# Patient Record
Sex: Female | Born: 1985 | Race: White | Hispanic: No | Marital: Single | Smoking: Never smoker
Health system: Southern US, Community
[De-identification: ages and names within clinical notes are randomized; demographics above are authoritative.]

## PROBLEM LIST (undated history)

## (undated) DIAGNOSIS — K219 Gastro-esophageal reflux disease without esophagitis: Secondary | ICD-10-CM

## (undated) DIAGNOSIS — Z8781 Personal history of (healed) traumatic fracture: Secondary | ICD-10-CM

## (undated) DIAGNOSIS — F419 Anxiety disorder, unspecified: Secondary | ICD-10-CM

## (undated) DIAGNOSIS — B019 Varicella without complication: Secondary | ICD-10-CM

## (undated) DIAGNOSIS — F329 Major depressive disorder, single episode, unspecified: Secondary | ICD-10-CM

## (undated) DIAGNOSIS — A630 Anogenital (venereal) warts: Secondary | ICD-10-CM

## (undated) DIAGNOSIS — F988 Other specified behavioral and emotional disorders with onset usually occurring in childhood and adolescence: Secondary | ICD-10-CM

## (undated) DIAGNOSIS — J302 Other seasonal allergic rhinitis: Secondary | ICD-10-CM

## (undated) HISTORY — DX: Personal history of (healed) traumatic fracture: Z87.81

## (undated) HISTORY — DX: Major depressive disorder, single episode, unspecified: F32.9

## (undated) HISTORY — DX: Anxiety disorder, unspecified: F41.9

## (undated) HISTORY — DX: Other specified behavioral and emotional disorders with onset usually occurring in childhood and adolescence: F98.8

## (undated) HISTORY — DX: Gastro-esophageal reflux disease without esophagitis: K21.9

## (undated) HISTORY — DX: Varicella without complication: B01.9

## (undated) HISTORY — DX: Other seasonal allergic rhinitis: J30.2

## (undated) HISTORY — DX: Anogenital (venereal) warts: A63.0

---
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
## 2010-09-07 ENCOUNTER — Encounter: Payer: Self-pay | Admitting: Neurology

## 2011-10-24 ENCOUNTER — Ambulatory Visit: Payer: BC Managed Care – PPO

## 2013-11-22 ENCOUNTER — Encounter: Payer: Self-pay | Admitting: Internal Medicine

## 2013-11-22 ENCOUNTER — Ambulatory Visit (INDEPENDENT_AMBULATORY_CARE_PROVIDER_SITE_OTHER): Payer: 59 | Admitting: Internal Medicine

## 2013-11-22 VITALS — BP 122/60 | Temp 97.6°F | Ht 67.25 in | Wt 156.0 lb

## 2013-11-22 DIAGNOSIS — F419 Anxiety disorder, unspecified: Secondary | ICD-10-CM

## 2013-11-22 DIAGNOSIS — F411 Generalized anxiety disorder: Secondary | ICD-10-CM

## 2013-11-22 DIAGNOSIS — J029 Acute pharyngitis, unspecified: Secondary | ICD-10-CM

## 2013-11-22 DIAGNOSIS — F988 Other specified behavioral and emotional disorders with onset usually occurring in childhood and adolescence: Secondary | ICD-10-CM | POA: Insufficient documentation

## 2013-11-22 MED ORDER — LORAZEPAM 1 MG PO TABS: 0.5000 mg | ORAL_TABLET | Freq: Every evening | ORAL | Status: DC | PRN

## 2013-11-22 MED ORDER — SERTRALINE HCL 100 MG PO TABS: 100.0000 mg | ORAL_TABLET | Freq: Every day | ORAL | Status: DC

## 2013-11-22 NOTE — Patient Instructions (Signed)
Continue the sertraline as directed for now .  Advise lorazepam over alprazolam  For the reasons discussed  Rebound anxiety etc.  All are dependent producing  and do not  drink alcohol if taking these medication.  ROV  For med check and preventive visit .

## 2013-11-22 NOTE — Progress Notes (Signed)
Chief Complaint  Patient presents with  . Establish Care    Need to establish with a PCP.    HPI: Patient comes in as new patient visit . [facility]    [place] [place]se[place] here  bu far  She is on medication and wants to establish. Her pediatrician was Dr. Particia JasperKathleen Lucas.   Mom thinks she is depressed since younger  Age 28 got divorced  Had seen reactive    And add . Diagnosed fifth grade  Had se  Settled on sertraline .   Down to 100 mg  fo rnow 8-9 years.  No counselor now [place] [facility] [employer]  Tried counselor recently  didn't want to go further. Bf living together   Issues. Doesn't feel she is terribly depressed or super anxious  Anxiety comes at night   And ruminates at night  And takes small amount ofXanax for sleep.  Not that often    45 xanax given at the beginning of last year and still has many in the bottle. It is almost expired.  He states he been on a number of medicines and she was younger with side effects and is a bit anxious if she were to change it  Add in 5th grade   In academic performance   Esp math . stopped taking since working full time.  Drifting   off [employer]  Counselor .  Dr Ty HiltsKincaid.  concerta 27 extended release  . E learning medica specialist   at this time.  ROS:  GEN/ HEENT: No fever, significant weight changes sweats headaches vision problems hearing changes, as a minor sore throat for one day no fever CV/ PULM; No chest pain shortness of breath cough, syncope,edema  change in exercise tolerance. GI /GU: No adominal pain, vomiting, change in bowel habits. No blood in the stool. No significant GU symptoms. SKIN/HEME: ,no acute skin rashes suspicious lesions or bleeding. No lymphadenopathy, nodules, masses.  NEURO/ PSYCH:  No neurologic signs such as weakness numbness.. IMM/ Allergy: No unusual infections.  Allergy .   REST of 12 system review negative except as per HPI  Past Medical History  Diagnosis Date  . Chicken pox   .  GERD (gastroesophageal reflux disease)   . Seasonal allergies   . Genital warts     age 28 not sex transmitted some in gi tract or airway? vs  hsv?  . Anxiety and depression     realted to parent divorce   . ADD (attention deficit disorder)     dx 5th grade  revbecca kincaid; effect school perfomance math mostly on med when in school or studies   . Hx of fracture of wrist     soccer  . History of fracture of foot     Family History  Problem Relation Age of Onset  . Hypertension Father   . Depression Father   . ADD / ADHD Father   . Arthritis Maternal Aunt   . Arthritis Maternal Grandmother   . Arthritis Paternal Grandmother   . Breast cancer Paternal Grandmother   . Stroke Paternal Grandmother   . Alcohol abuse Paternal Grandfather   . Colon cancer Paternal Grandfather     History   Social History  . Marital Status: Single    Spouse Name: N/A    Number of Children: 0  . Years of Education: N/A   Social History Main Topics  . Smoking status: Never Smoker   .  Smokeless tobacco: None  . Alcohol Use: Yes     Comment: 3-4 times weekly will have 2 glasses of wine.  . Drug Use: No  . Sexual Activity: Yes    Partners: Male   Other Topics Concern  . None   Social History Narrative   8 hours of sleep per night   2 people living in the home bf   Has a dog in the home\   Neg tob soc etoh 4-5 pwe qwwk    caffeine am and tea in pm  Neg rd    Exercise more than 3 x per week.    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 etonogestrel-ethinyl estradiol (NUVARING) 0.12-0.015 MG/24HR vaginal ring Place 1 each vaginally every 28 (twenty-eight) days. Insert vaginally and leave in place for 3 consecutive weeks, then remove for 1 week.  . sertraline (ZOLOFT) 100 MG tablet Take 1 tablet (100 mg total) by mouth daily.  . [DISCONTINUED] ALPRAZolam  (XANAX) 0.5 MG tablet Take 1/2 to 1 tablet twice a day PRN  . [DISCONTINUED] sertraline (ZOLOFT) 100 MG tablet Take 100 mg by mouth daily.  Marland Kitchen LORazepam (ATIVAN) 1 MG tablet Take 0.5-1 tablets (0.5-1 mg total) by mouth at bedtime as needed for anxiety.  . methylphenidate (CONCERTA) 27 MG CR tablet Take 27 mg by mouth every morning.    EXAM:  BP 122/60  Temp(Src) 97.6 F (36.4 C) (Oral)  Ht 5' 7.25" (1.708 m)  Wt 156 lb (70.761 kg)  BMI 24.26 kg/m2  LMP 11/10/2013  Body mass index is 24.26 kg/(m^2). Physical Exam: Vital signs reviewed NWG:NFAO is a well-developed well-nourished alert cooperative  female who appears her stated age in no acute distress.  Som excess motor movement  HEENT: normocephalic atraumatic , Eyes: PERRL EOM's full, conjunctiva clear, Nares: paten,t no deformity discharge or tenderness., Ears: no deformity EAC's clear TMs with normal landmarks. Mouth: clear OP, no lesions, edema normal erythema.  Moist mucous membranes. Dentition in adequate repair. NECK: supple without masses, thyromegaly or bruits. CHEST/PULM:  Clear to auscultation and percussion breath sounds equal no wheeze , rales or rhonchi.CV: PMI is nondisplaced, S1 S2 no gallops, murmurs, rubs. Peripheral pulses are full without delay.No JVD .  ABDOMEN: Bowel sounds normal nontender  No guard or rebound, no hepato splenomegal no CVA tenderness.  Extremtities:  No clubbing cyanosis or edema, no acute joint swelling or redness no focal atrophy NEURO:  Oriented x3, cranial nerves 3-12 appear to be intact, no obvious focal weakness,gait within normal limits no tremors  SKIN: No acute rashes normal turgor, color, no bruising or petechiae. PSYCH: Oriented, good eye contact, no obvious depression anxiety, cognition and judgment appear normal. LN: no cervical adenopathy     ASSESSMENT AND PLAN:  Discussed the following assessment and plan:  ADD (attention deficit disorder) - dx 5th grade bypsych on meds fopr  school concerta 27 not needed recenetly   Anxiety - effecting sleep initiation at times  prev pcp gave xanax .5 hs prn still has left over 45 from a year ago cont zoloft at 100 mg   Sore throat - 1 day  poss viral expectant managment Patient apparently rarely uses the benzos for night but discussed warnings and risk of this medication of rebound and dependency. Will switch to lorazepam although she has anxiety about changing  to medicine she doesn't know. She hasn't used very much and shows a bottle given to her over a year ago but still has many tablets remaining. She's currently not taking her Concerta but has an old bottle. If need be we can prescribe this medication discussed contract and tox screens etc. I think she has done well on her sertraline at this time would not change at continue 100 mg followup in about 6 months for preventive visit and med check. -Patient advised to return or notify health care team  if symptoms worsen ,persist or new concerns arise.  Patient Instructions  Continue the sertraline as directed for now .  Advise lorazepam over alprazolam  For the reasons discussed  Rebound anxiety etc.  All are dependent producing  and do not  drink alcohol if taking these medication.  ROV  For med check and preventive visit .         Neta Mends. Skilar Marcou M.D.  Pre visit review using our clinic review tool, if applicable. No additional management support is needed unless otherwise documented below in the visit note.

## 2013-12-26 ENCOUNTER — Telehealth: Payer: Self-pay | Admitting: Internal Medicine

## 2013-12-26 NOTE — Telephone Encounter (Signed)
Patient Information:  Caller Name: Hale BogusMara  Phone: [phone]  Patient: Patty Branch, Patty Branch  Gender: Female  DOB: 1986/07/02  Age: [age] Years  PCP: Berniece AndreasPanosh, Wanda (Family Practice)  Pregnant: No  Office Follow Up:  Does the office need to follow up with this patient?: No  Instructions For The Office: N/A   Symptoms  Reason For Call & Symptoms: Woke up for past 3 mornings with Hives all over except not on her face. Rash goes away by the end of the day. She woke up itching last night and took cool shower and switched beds and still woke up with rash. She has had occasional cough and stuffy runny nose for past 3 weeks. She switched from Zyrtec to Claritin one week ago and has been taking Claritin daily- last dose was at 0400. Hives starting to fade in some areas now 10:30. Advised OV for Allergy Sx not helped with antihistamine (cough, runny nose) and she refused. She is going to try Benedryl 25 mgs PO every 6 hours tonight and will call back if not helping with sx.  Reviewed Health History In EMR: Yes  Reviewed Medications In EMR: Yes  Reviewed Allergies In EMR: Yes  Reviewed Surgeries / Procedures: Yes  Date of Onset of Symptoms: 12/23/2013  Treatments Tried: Claritin, Hydrocortisone Cream  Treatments Tried Worked: No OB / GYN:  LMP: 12/05/2013  Guideline(s) Used:  Rash or Redness - Widespread  Hives  Hay Fever - Nasal Allergies  Disposition Per Guideline:   See Today or Tomorrow in Office  Reason For Disposition Reached:   Nasal discharge present > 10 days  Advice Given:  Food Related Hives:  Foods can cause transient hives, especially around the mouth.  Some are mild food allergies; others can occur in anyone (e.g., with strawberries).  Hives from foods usually disappear within 6 hours.  Antihistamine  One or two dosages of an antihistamine will accelerate the clearing of this type of hives.  Benadryl (diphenhydramine) is an antihistamine. The adult dose is 25-50 mg. If the hives  are still present after 6 hours, repeat the Benadryl.  Prevention  : In the future, avoid any food you think caused the hives.  Prevention  : In the future, avoid any food you think caused the hives.  Call Back If:  Severe hives or severe itching persist more than 24 hours despite taking an antihistamine (e.g., Benadryl)  You become worse.  Widespread Hives:  Remove allergens. For widespread hives be certain to take a bath or shower, if triggered by pollens or animal contact. Change clothes.  Take a cool bath for 10 minutes to relieve itching. Rub very itchy areas with an ice cube for 10 minutes.  Hives normally come and go for 3 or 4 days, then disappear.  Antihistamine  Take an antihistamine like loratadine (e.g., OTC Claritin, Alavert) for widespread hives that itch. The adult dosage of loratadine is 10 mg by mouth once each day. Continue the antihistamine until the hives have been gone for 24 hours.  Loratidine is a newer (second-generation) antihistamine and it causes less sedation than diphenhydramine (Benadryl) or chlorpheniramine (Chlor-Trimeton).  CAUTION: This type of medication may cause sleepiness. Do not drink alcohol, drive, or operate dangerous machinery while taking antihistamines. Do not take these medications if you have prostate problems.  Contagiousness:  Hives are not contagious. You can return to work or school if the hives do not interfere with normal activities.  Prevention:  If you identify a substance  that causes hives, try to avoid that substance in the future.  Call Back If:  Severe itching lasts longer than 24 hours while taking an antihistamine  Hives persist longer than 1 week  You become worse.  Wash off Pollen Daily:  Remove pollen from the body with hair washing and a shower, especially before bedtime.  Avoiding Pollen:  Stay indoors on windy days  Keep windows closed in home, at least in bedroom; use air conditioner  Use a high efficiency house air  filter (HEPA or electrostatic)  Keep windows closed in car, turn AC on recirculate  Avoid playing with outdoor dog  For a Stuffy Nose - Use Nasal Washes:  Introduction: Saline (salt water) nasal irrigation (nasal washes) is an effective and simple home remedy for treating stuffy nose and sinus congestion. The nose can be irrigated by pouring, spraying, or squirting salt water into the nose and then letting it run back out.  How it Helps: The salt water rinses out excess mucus, washes out any irritants (dust, allergens) that might be present, and moistens the nasal cavity.  Antihistamine Medications for Hay Fever:  Antihistamines help reduce sneezing, itching, and runny nose.  You may need to take antihistamines continuously during pollen season (Reason: continuously is the key to control).  Call Back If:  Symptoms are not controlled in 2 days with continuous antihistamines  You become worse  Patient Refused Recommendation:  Patient Will Make Own Appointment  [facility] and will call back if hives persist.

## 2013-12-26 NOTE — Telephone Encounter (Signed)
Noted  

## 2014-03-29 ENCOUNTER — Telehealth: Payer: Self-pay | Admitting: Internal Medicine

## 2014-03-29 NOTE — Telephone Encounter (Signed)
Pt is needing new rx sertraline (ZOLOFT) 100 MG tablet, send to [facility].

## 2014-03-29 NOTE — Telephone Encounter (Signed)
Called and spoke to Korearista at the pharmacy.  Pt picked this prescription up on 03/27/14 and still has refills on file.  Request denied at this time.

## 2014-04-12 ENCOUNTER — Ambulatory Visit (INDEPENDENT_AMBULATORY_CARE_PROVIDER_SITE_OTHER): Payer: 59 | Admitting: Physician Assistant

## 2014-04-12 ENCOUNTER — Encounter: Payer: Self-pay | Admitting: Physician Assistant

## 2014-04-12 VITALS — BP 120/80 | HR 73 | Temp 98.2°F | Resp 18 | Wt 158.1 lb

## 2014-04-12 DIAGNOSIS — J0100 Acute maxillary sinusitis, unspecified: Secondary | ICD-10-CM

## 2014-04-12 MED ORDER — DOXYCYCLINE HYCLATE 100 MG PO TABS: 100.0000 mg | ORAL_TABLET | Freq: Two times a day (BID) | ORAL | Status: DC

## 2014-04-12 MED ORDER — HYDROCODONE-HOMATROPINE 5-1.5 MG/5ML PO SYRP: 5.0000 mL | ORAL_SOLUTION | Freq: Three times a day (TID) | ORAL | Status: DC | PRN

## 2014-04-12 NOTE — Progress Notes (Signed)
Pre visit review using our clinic review tool, if applicable. No additional management support is needed unless otherwise documented below in the visit note. 

## 2014-04-12 NOTE — Patient Instructions (Addendum)
Doxycycline twice daily for 10 days for sinus infection. Make sure to take with a full meal to prevent nausea. Reduce sun exposure while taking as it can increase sensitivity to sun burn, and make sure to wear barrier protection in addition to taking your birth control pills as this medication is contraindicated during pregnancy.  Hycodan every 8 hours as needed for cough. Do not drive while taking this medication as it will inhibit your ability to operate a motor vehicle.  Plain Over the Counter Mucinex (NOT Mucinex D) for thick secretions  Force NON dairy fluids, drinking plenty of water is best.    Over the Counter Flonase OR Nasacort AQ 1 spray in each nostril twice a day as needed. Use the "crossover" technique into opposite nostril spraying toward opposite ear @ 45 degree angle, not straight up into nostril.   Plain Over the Counter Allegra (NOT D )  160 daily , OR Loratidine 10 mg , OR Zyrtec 10 mg @ bedtime  as needed for itchy eyes & sneezing.  Saline Irrigation and Saline Sprays can also help reduce symptoms.  If emergency symptoms discussed during visit developed, seek medical attention immediately.  Followup as needed, or for worsening or persistent symptoms despite treatment.    Sinusitis Sinusitis is redness, soreness, and puffiness (inflammation) of the air pockets in the bones of your face (sinuses). The redness, soreness, and puffiness can cause air and mucus to get trapped in your sinuses. This can allow germs to grow and cause an infection.  HOME CARE   Drink enough fluids to keep your pee (urine) clear or pale yellow.  Use a humidifier in your home.  Run a hot shower to create steam in the bathroom. Sit in the bathroom with the door closed. Breathe in the steam 3-4 times a day.  Put a warm, moist washcloth on your face 3-4 times a day, or as told by your doctor.  Use salt water sprays (saline sprays) to wet the thick fluid in your nose. This can help the sinuses  drain.  Only take medicine as told by your doctor. GET HELP RIGHT AWAY IF:   Your pain gets worse.  You have very bad headaches.  You are sick to your stomach (nauseous).  You throw up (vomit).  You are very sleepy (drowsy) all the time.  Your face is puffy (swollen).  Your vision changes.  You have a stiff neck.  You have trouble breathing. MAKE SURE YOU:   Understand these instructions.  Will watch your condition.  Will get help right away if you are not doing well or get worse. Document Released: 01/21/2008 Document Revised: 04/28/2012 Document Reviewed: 03/09/2012 [facility] Patient Information 2015 [place], [place]. This information is not intended to replace advice given to you by your health care provider. Make sure you discuss any questions you have with your health care provider.

## 2014-04-12 NOTE — Progress Notes (Signed)
Subjective:    Patient ID: Patty Branch, female    DOB: August 03, 1986, 28 y.o.   MRN: [id number]  Cough This is a new problem. The current episode started 1 to 4 weeks ago (10 days). The problem has been gradually worsening. The problem occurs hourly (few times per hour). The cough is productive of sputum. Associated symptoms include chest pain (central chest, sternal area, very superficial. Hurts worse with coughing spells, no radiation), nasal congestion, postnasal drip, rhinorrhea and shortness of breath (unsure if this is related, or if it is just anxiety over work (history of anxiety treate with zoloft)). Pertinent negatives include no chills, ear congestion, ear pain, fever, headaches, heartburn, hemoptysis, myalgias, rash, sore throat, sweats, weight loss or wheezing. Nothing aggravates the symptoms. Treatments tried: ibuprofen for chest pain. The treatment provided moderate (advil helps the pain) relief. Her past medical history is significant for asthma (exercise induced asthma, no symptoms of this for years) and environmental allergies (takes claritin for this.). There is no history of COPD.      Review of Systems  Constitutional: Negative for fever, chills and weight loss.  HENT: Positive for congestion, postnasal drip, rhinorrhea and sinus pressure (mild). Negative for ear discharge, ear pain and sore throat.   Respiratory: Positive for cough and shortness of breath (unsure if this is related, or if it is just anxiety over work (history of anxiety treate with zoloft)). Negative for hemoptysis and wheezing.   Cardiovascular: Positive for chest pain (central chest, sternal area, very superficial. Hurts worse with coughing spells, no radiation).  Gastrointestinal: Negative for heartburn, nausea, vomiting and diarrhea.  Musculoskeletal: Negative for myalgias.  Skin: Negative for rash.  Allergic/Immunologic: Positive for environmental allergies (takes claritin for this.).  Neurological:  Negative for syncope and headaches.  All other systems reviewed and are negative.    Past Medical History  Diagnosis Date  . Chicken pox   . GERD (gastroesophageal reflux disease)   . Seasonal allergies   . Genital warts     age 21 not sex transmitted some in gi tract or airway? vs  hsv?  . Anxiety and depression     realted to parent divorce   . ADD (attention deficit disorder)     dx 5th grade  revbecca kincaid; effect school perfomance math mostly on med when in school or studies   . Hx of fracture of wrist     soccer  . History of fracture of foot     History   Social History  . Marital Status: Single    Spouse Name: N/A    Number of Children: 0  . Years of Education: N/A   Occupational History  . Not on file.   Social History Main Topics  . Smoking status: Never Smoker   . Smokeless tobacco: Not on file  . Alcohol Use: Yes     Comment: 3-4 times weekly will have 2 glasses of wine.  . Drug Use: No  . Sexual Activity: Yes    Partners: Male   Other Topics Concern  . Not on file   Social History Narrative   8 hours of sleep per night   2 people living in the home bf   Has a dog in the home\   Neg tob soc etoh 4-5 pwe qwwk    caffeine am and tea in pm  Neg rd    Exercise more than 3 x per week.    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 Tonsillectomy and adenoidectomy  1993    Family History  Problem Relation Age of Onset  . Hypertension Father   . Depression Father   . ADD / ADHD Father   . Arthritis Maternal Aunt   . Arthritis Maternal Grandmother   . Arthritis Paternal Grandmother   . Breast cancer Paternal Grandmother   . Stroke Paternal Grandmother   . Alcohol abuse Paternal Grandfather   . Colon cancer Paternal Grandfather     Allergies  Allergen Reactions  . Amoxicillin     Unsure of reaction.  Found when she was  an infant  . Augmentin [Amoxicillin-Pot Clavulanate]     Unsure of reaction.  Found when she was an infant  . Ceclor [Cefaclor]     Unsure of reaction.  Found when she was an infant  . Duricef [Cefadroxil]     Unsure of reaction.  Found when she was an infant.  . Lorabid [Loracarbef]     Unsure of reaction.  Found when she was an infant    Current Outpatient Prescriptions on File Prior to Visit  Medication Sig Dispense Refill  . etonogestrel-ethinyl estradiol (NUVARING) 0.12-0.015 MG/24HR vaginal ring Place 1 each vaginally every 28 (twenty-eight) days. Insert vaginally and leave in place for 3 consecutive weeks, then remove for 1 week.      . methylphenidate (CONCERTA) 27 MG CR tablet Take 27 mg by mouth every morning.      . sertraline (ZOLOFT) 100 MG tablet Take 1 tablet (100 mg total) by mouth daily.  90 tablet  2  . LORazepam (ATIVAN) 1 MG tablet Take 0.5-1 tablets (0.5-1 mg total) by mouth at bedtime as needed for anxiety.  30 tablet  0   No current facility-administered medications on file prior to visit.    EXAM: BP 120/80  Pulse 73  Temp(Src) 98.2 F (36.8 C) (Oral)  Resp 18  Wt 158 lb 1.6 oz (71.714 kg)  SpO2 99%  LMP 03/29/2014     Objective:   Physical Exam  Nursing note and vitals reviewed. Constitutional: She is oriented to person, place, and time. She appears well-developed and well-nourished. No distress.  HENT:  Head: Normocephalic and atraumatic.  Right Ear: External ear normal.  Left Ear: External ear normal.  Nose: Nose normal.  Mouth/Throat: No oropharyngeal exudate.  Oropharynx is slightly erythematous, no exudate. Bilateral TMs normal. Bilateral frontal sinuses non-TTP. Right maxillary sinus non-TTP. Left maxillary sinus TTP.  Eyes: Conjunctivae and EOM are normal. Pupils are equal, round, and reactive to light.  Neck: Normal range of motion. Neck supple.  Cardiovascular: Normal rate, regular rhythm and intact distal pulses.   Pulmonary/Chest:  Effort normal and breath sounds normal. No stridor. No respiratory distress. She has no wheezes. She has no rales. She exhibits no tenderness.  Lymphadenopathy:    She has no cervical adenopathy.  Neurological: She is alert and oriented to person, place, and time.  Skin: Skin is warm and dry. She is not diaphoretic.  Psychiatric: She has a normal mood and affect. Her behavior is normal. Judgment and thought content normal.     No results found for this basename: WBC, HGB, HCT, PLT, GLUCOSE, CHOL, TRIG, HDL, LDLDIRECT, LDLCALC, ALT, AST, NA, K, CL, CREATININE, BUN, CO2, TSH, PSA, INR, GLUF, HGBA1C, MICROALBUR        Assessment & Plan:  Hale Bogus  was seen today for cough.  Diagnoses and associated orders for this visit:  Acute maxillary sinusitis, recurrence not specified Comments: focal ttp, 10 days of symptoms, will treat with Doxycycline and hycodan for cough. add otc mucinex, nasal steroid, antihistamine, rest, push fluids. - HYDROcodone-homatropine (HYCODAN) 5-1.5 MG/5ML syrup; Take 5 mLs by mouth every 8 (eight) hours as needed for cough. - doxycycline (VIBRA-TABS) 100 MG tablet; Take 1 tablet (100 mg total) by mouth 2 (two) times daily.    Patient was advised against driving while taking Hycodan as it can inhibit her ability to operate a motor vehicle.  Patient also advised to limit sun exposure as doxycycline can increase her sensitivity to sun.   Return precautions provided, and patient handout on sinusitis.  Plan to follow up as needed, or for worsening or persistent symptoms despite treatment.  Patient Instructions  Doxycycline twice daily for 10 days for sinus infection. Make sure to take with a full meal to prevent nausea. Reduce sun exposure while taking as it can increase sensitivity to sun burn, and make sure to wear barrier protection in addition to taking your birth control pills as this medication is contraindicated during pregnancy.  Hycodan every 8 hours as needed  for cough. Do not drive while taking this medication as it will inhibit your ability to operate a motor vehicle.  Plain Over the Counter Mucinex (NOT Mucinex D) for thick secretions  Force NON dairy fluids, drinking plenty of water is best.    Over the Counter Flonase OR Nasacort AQ 1 spray in each nostril twice a day as needed. Use the "crossover" technique into opposite nostril spraying toward opposite ear @ 45 degree angle, not straight up into nostril.   Plain Over the Counter Allegra (NOT D )  160 daily , OR Loratidine 10 mg , OR Zyrtec 10 mg @ bedtime  as needed for itchy eyes & sneezing.  Saline Irrigation and Saline Sprays can also help reduce symptoms.  If emergency symptoms discussed during visit developed, seek medical attention immediately.  Followup as needed, or for worsening or persistent symptoms despite treatment.

## 2014-08-15 ENCOUNTER — Other Ambulatory Visit: Payer: Self-pay | Admitting: Internal Medicine

## 2014-08-17 ENCOUNTER — Telehealth: Payer: Self-pay | Admitting: Family Medicine

## 2014-08-17 ENCOUNTER — Other Ambulatory Visit: Payer: Self-pay | Admitting: Family Medicine

## 2014-08-17 DIAGNOSIS — Z0000 Encounter for general adult medical examination without abnormal findings: Secondary | ICD-10-CM

## 2014-08-17 NOTE — Telephone Encounter (Signed)
Pt needs yearly wellness visit in 90 days.  Please help the pt to get on the schedule.  Then send message back and will place lab orders.  Thanks!

## 2014-08-17 NOTE — Telephone Encounter (Signed)
Ok to refill x 90 days  Have her get preventive visit yearly check before runs out.

## 2014-08-17 NOTE — Telephone Encounter (Signed)
Sent to the pharmacy by e-scribe for 90 days.  Will send a message to scheduling to help her get on the schedule.

## 2014-08-17 NOTE — Telephone Encounter (Signed)
Labs should be done one week prior to CPX.  Please schedule unless pt is unable.  Thanks!

## 2014-08-17 NOTE — Telephone Encounter (Signed)
Pt called to fu on rx zoloft.  I saw your note and scheduled pt cpe in feb.

## 2014-08-17 NOTE — Telephone Encounter (Signed)
Pt request same day due to schedule.

## 2014-10-11 ENCOUNTER — Ambulatory Visit (INDEPENDENT_AMBULATORY_CARE_PROVIDER_SITE_OTHER): Payer: 59 | Admitting: Internal Medicine

## 2014-10-11 ENCOUNTER — Encounter: Payer: Self-pay | Admitting: Internal Medicine

## 2014-10-11 VITALS — BP 104/70 | Temp 97.9°F | Ht 66.5 in | Wt 153.7 lb

## 2014-10-11 DIAGNOSIS — Z79899 Other long term (current) drug therapy: Secondary | ICD-10-CM

## 2014-10-11 DIAGNOSIS — Z0000 Encounter for general adult medical examination without abnormal findings: Secondary | ICD-10-CM

## 2014-10-11 DIAGNOSIS — F909 Attention-deficit hyperactivity disorder, unspecified type: Secondary | ICD-10-CM

## 2014-10-11 DIAGNOSIS — F4323 Adjustment disorder with mixed anxiety and depressed mood: Secondary | ICD-10-CM

## 2014-10-11 DIAGNOSIS — F988 Other specified behavioral and emotional disorders with onset usually occurring in childhood and adolescence: Secondary | ICD-10-CM

## 2014-10-11 MED ORDER — ALPRAZOLAM 0.5 MG PO TABS: ORAL_TABLET | ORAL | Status: DC

## 2014-10-11 MED ORDER — METHYLPHENIDATE HCL ER (OSM) 18 MG PO TBCR: 18.0000 mg | EXTENDED_RELEASE_TABLET | Freq: Every day | ORAL | Status: DC

## 2014-10-11 NOTE — Progress Notes (Signed)
Pre visit review using our clinic review tool, if applicable. No additional management support is needed unless otherwise documented below in the visit note.  Chief Complaint  Patient presents with  . Annual Exam  . ADHD  . Medication Refill    HPI: Patient  Patty Branch Donate  29 y.o. comes in today for Preventive Health Care visit  And medication management   adhd just started taking concerta   To do gre  27 mg  makin her feel strange pounding hart     18 is better .   Sat   gre   gre test.  to  international  and communication.   Would like to  Apply  For college in  [place] .  Last pap beth lane .  This past year.  Never took the ativan  Would prefer the alprazol 0.5 1/2 to 1  Had some left over   over if needed in night for anxiety . Asks about how  to wean zoloft   On med mostly for depression since age 89  Missed a few doses unintentionally and felt bad. Hx of counselors in past never cliqued or helpd that much  .  Currently not depressed  Health Maintenance  Topic Date Due  . HIV Screening  05/06/2001  . INFLUENZA VACCINE  11/16/2014 (Originally 03/18/2014)  . PAP SMEAR  04/18/2016  . TETANUS/TDAP  08/18/2021   Health Maintenance Review LIFESTYLE:  Exercise:  Teach zumba class  And gym Tobacco/ETS: no Alcohol:  8 per week .  Sugar beverages:  coffe honey  Sleep:  About 8 hours  Drug use: no PAP: utd  Periods  Regular on ring   ROS:  GEN/ HEENT: No fever, significant weight changes sweats headaches vision problems hearing changes, CV/ PULM; No chest pain shortness of breath cough, syncope,edema  change in exercise tolerance. GI /GU: No adominal pain, vomiting, change in bowel habits. No blood in the stool. No significant GU symptoms. SKIN/HEME: ,no acute skin rashes suspicious lesions or bleeding. No lymphadenopathy, nodules, masses.  NEURO/ PSYCH:  No neurologic signs such as weakness numbness. No depression anxiety. IMM/ Allergy: No unusual infections.  Allergy .     REST of 12 system review negative except as per HPI   Past Medical History  Diagnosis Date  . Chicken pox   . GERD (gastroesophageal reflux disease)   . Seasonal allergies   . Genital warts     age 29 not sex transmitted some in gi tract or airway? vs  hsv?  . Anxiety and depression     realted to parent divorce   . ADD (attention deficit disorder)     dx 5th grade  rev[employer]; effect school perfomance math mostly on med when in school or studies   . Hx of fracture of wrist     soccer  . History of fracture of foot     Past Surgical History  Procedure Laterality Date  . Tonsillectomy and adenoidectomy  1993    Family History  Problem Relation Age of Onset  . Hypertension Father   . Depression Father   . ADD / ADHD Father   . Arthritis Maternal Aunt   . Arthritis Maternal Grandmother   . Arthritis Paternal Grandmother   . Breast cancer Paternal Grandmother   . Stroke Paternal Grandmother   . Alcohol abuse Paternal Grandfather   . Colon cancer Paternal Grandfather     History   Social History  . Marital Status:  Single    Spouse Name: N/A  . Number of Children: 0  . Years of Education: N/A   Social History Main Topics  . Smoking status: Never Smoker   . Smokeless tobacco: Not on file  . Alcohol Use: Yes     Comment: 3-4 times weekly will have 2 glasses of wine.  . Drug Use: No  . Sexual Activity:    Partners: Male   Other Topics Concern  . None   Social History Narrative   8 hours of sleep per night   2 people living in the home bf   Has a dog in the home\   Neg tob soc etoh 4-5 pwe qwwk    caffeine am and tea in pm  Neg rd    Exercise more than 3 x per week.    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 etonogestrel-ethinyl estradiol (NUVARING) 0.12-0.015 MG/24HR vaginal ring Place 1 each vaginally every  28 (twenty-eight) days. Insert vaginally and leave in place for 3 consecutive weeks, then remove for 1 week.  . sertraline (ZOLOFT) 100 MG tablet take 1 tablet by mouth once daily  . [DISCONTINUED] methylphenidate (CONCERTA) 27 MG CR tablet Take 27 mg by mouth every morning.  Marland Kitchen ALPRAZolam (XANAX) 0.5 MG tablet 1/2- 1 po hs as needed for anxiety  Avoid regular use.  Do not take with alcohol.  . methylphenidate (CONCERTA) 18 MG PO CR tablet Take 1 tablet (18 mg total) by mouth daily.  . [DISCONTINUED] doxycycline (VIBRA-TABS) 100 MG tablet Take 1 tablet (100 mg total) by mouth 2 (two) times daily.  . [DISCONTINUED] HYDROcodone-homatropine (HYCODAN) 5-1.5 MG/5ML syrup Take 5 mLs by mouth every 8 (eight) hours as needed for cough.  . [DISCONTINUED] LORazepam (ATIVAN) 1 MG tablet Take 0.5-1 tablets (0.5-1 mg total) by mouth at bedtime as needed for anxiety. (Patient not taking: Reported on 10/11/2014)    EXAM:  BP 104/70 mmHg  Temp(Src) 97.9 F (36.6 C) (Oral)  Ht 5' 6.5" (1.689 m)  Wt 153 lb 11.2 oz (69.718 kg)  BMI 24.44 kg/m2  Body mass index is 24.44 kg/(m^2).  Physical Exam: Vital signs reviewed YQM:VHQI is a well-developed well-nourished alert cooperative    who appearsr stated age in no acute distress.  HEENT: normocephalic atraumatic , Eyes: PERRL EOM's full, conjunctiva clear, Nares: paten,t no deformity discharge or tenderness., Ears: no deformity EAC's clear TMs with normal landmarks. Mouth: clear OP, no lesions, edema.  Moist mucous membranes. Dentition in adequate repair. NECK: supple without masses, thyromegaly or bruits. CHEST/PULM:  Clear to auscultation and percussion breath sounds equal no wheeze , rales or rhonchi. No chest wall deformities or tenderness. Breast: normal by inspection . No dimpling, discharge, masses, tenderness or discharge . CV: PMI is nondisplaced, S1 S2 no gallops, murmurs, rubs. Peripheral pulses are full without delay.No JVD .  ABDOMEN: Bowel sounds  normal nontender  No guard or rebound, no hepato splenomegal no CVA tenderness.  No hernia. Extremtities:  No clubbing cyanosis or edema, no acute joint swelling or redness no focal atrophy NEURO:  Oriented x3, cranial nerves 3-12 appear to be intact, no obvious focal weakness,gait within normal limits no abnormal reflexes or asymmetrical SKIN: No acute rashes normal turgor, color, no bruising or petechiae. PSYCH: Oriented, good eye contact, no obvious depression anxiety, cognition and judgment  appear normal. LN: no cervical axillary inguinal adenopathy Declined lab  And no compelling indicatino for bledd tests based on  Hx and exam  ASSESSMENT AND PLAN:  Discussed the following assessment and plan:  Visit for preventive health examination - counseled  utd  hcm  ADD (attention deficit disorder) - benefit more than risk at this tome low dose call for refills ROV in 6 months or as indicated for med check   Medication management - dsic wean disc  benzos not to be used on reg basis  pt aware and not with etoh  Adjustment disorder with mixed anxiety and depressed mood - in remission on med since teen disc risk benefot  and weaning ? if ocusnleign help has had some in past asks for apraz 1/2 if needed never got lorazep filled  Declines blood tests  Low risk; hx of blood donation.  Thinks lipids have been checked in past . Ho on phq 9 can use for wean .  limiting etoh to 7  Per week  Avoid alpra reg use .  Dec concerta cause of se  Noted. ROV in 6 months or   Earlier if needed  Contact us  For refills if needed  Patient Care Team: Madelin Headings, MD as PCP - General (Internal Medicine) Lynden Ang, NP as Nurse Practitioner (Obstetrics and Gynecology) Patient Instructions  Try lower dose of the concerta as discussed .  Weaning zoloft. pcik a time without a lot of life changes . Alternate 100 mg with 59 mg for 2 weeks   50 per day for 2 weeks . Contact us  At that time and can get  You 50  mg tabs and decrease to 25 alternate with 50 for 2 weeks the 25 mg per day for 2 wees Alternate 25 and off  As tolerated ant then off.  If not sure contact us If  Increase sx do not wean any more and hold for a few weeks longer to see if passess.   Healthy lifestyle includes : At least 150 minutes of exercise weeks  , weight at healthy levels, which is usually   BMI 19-25. Avoid trans fats and processed foods;  Increase fresh fruits and veges to 5 servings per day. And avoid sweet beverages including tea and juice. Mediterranean diet with olive oil and nuts have been noted to be heart and brain healthy . Avoid tobacco products . Limit  alcohol to  7 per week for women and 14 servings for men.  Get adequate sleep . Wear seat belts . Don't text and drive .       Neta Mends. Kaylinn Dedic M.D.

## 2014-10-11 NOTE — Patient Instructions (Signed)
Try lower dose of the concerta as discussed .  Weaning zoloft. pcik a time without a lot of life changes . Alternate 100 mg with 59 mg for 2 weeks   50 per day for 2 weeks . Contact us  At that time and can get  You 50 mg tabs and decrease to 25 alternate with 50 for 2 weeks the 25 mg per day for 2 wees Alternate 25 and off  As tolerated ant then off.  If not sure contact us If  Increase sx do not wean any more and hold for a few weeks longer to see if passess.   Healthy lifestyle includes : At least 150 minutes of exercise weeks  , weight at healthy levels, which is usually   BMI 19-25. Avoid trans fats and processed foods;  Increase fresh fruits and veges to 5 servings per day. And avoid sweet beverages including tea and juice. Mediterranean diet with olive oil and nuts have been noted to be heart and brain healthy . Avoid tobacco products . Limit  alcohol to  7 per week for women and 14 servings for men.  Get adequate sleep . Wear seat belts . Don't text and drive .

## 2014-11-03 ENCOUNTER — Ambulatory Visit (INDEPENDENT_AMBULATORY_CARE_PROVIDER_SITE_OTHER): Payer: 59 | Admitting: Internal Medicine

## 2014-11-03 ENCOUNTER — Encounter: Payer: Self-pay | Admitting: Internal Medicine

## 2014-11-03 VITALS — BP 110/70 | HR 76 | Temp 97.8°F | Resp 20 | Ht 66.5 in | Wt 157.0 lb

## 2014-11-03 DIAGNOSIS — B9789 Other viral agents as the cause of diseases classified elsewhere: Secondary | ICD-10-CM

## 2014-11-03 DIAGNOSIS — J029 Acute pharyngitis, unspecified: Secondary | ICD-10-CM

## 2014-11-03 DIAGNOSIS — J069 Acute upper respiratory infection, unspecified: Secondary | ICD-10-CM

## 2014-11-03 NOTE — Patient Instructions (Signed)
Acute bronchitis symptoms for less than 10 days are generally not helped by antibiotics.  Take over-the-counter expectorants and cough medications such as  Mucinex DM.  Call if there is no improvement in 5 to 7 days or if  you develop worsening cough, fever, or new symptoms, such as shortness of breath or chest pain.   

## 2014-11-03 NOTE — Progress Notes (Signed)
Subjective:    Patient ID: Patty Branch, female    DOB: 1985-08-25, 29 y.o.   MRN: [id number]  HPI  29 year old female who presents with a several day history of sore throat, head and chest congestion, cough and bilateral ear discomfort.  No fever.  She has been using DayQuil and ibuprofen.  She has multiple antibiotic allergies.  Past Medical History  Diagnosis Date  . Chicken pox   . GERD (gastroesophageal reflux disease)   . Seasonal allergies   . Genital warts     age 74 not sex transmitted some in gi tract or airway? vs  hsv?  . Anxiety and depression     realted to parent divorce   . ADD (attention deficit disorder)     dx 5th grade  rev[employer] kinc[employer]; effect school perfomance math mostly on med when in school or studies   . Hx of fracture of wrist     soccer  . History of fracture of foot     History   Social History  . Marital Status: Single    Spouse Name: N/A  . Number of Children: 0  . Years of Education: N/A   Occupational History  . Not on file.   Social History Main Topics  . Smoking status: Never Smoker   . Smokeless tobacco: Not on file  . Alcohol Use: Yes     Comment: 3-4 times weekly will have 2 glasses of wine.  . Drug Use: No  . Sexual Activity:    Partners: Male   Other Topics Concern  . Not on file   Social History Narrative   8 hours of sleep per night   2 people living in the home bf   Has a dog in the home\   Neg tob soc etoh 4-5 pwe qwwk    caffeine am and tea in pm  Neg rd    Exercise more than 3 x per week.    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 Tonsillectomy and adenoidectomy  1993    Family History  Problem Relation Age of Onset  . Hypertension Father   . Depression Father   . ADD / ADHD Father   . Arthritis Maternal Aunt   . Arthritis Maternal Grandmother     . Arthritis Paternal Grandmother   . Breast cancer Paternal Grandmother   . Stroke Paternal Grandmother   . Alcohol abuse Paternal Grandfather   . Colon cancer Paternal Grandfather     Allergies  Allergen Reactions  . Amoxicillin     Unsure of reaction.  Found when she was an infant  . Augmentin [Amoxicillin-Pot Clavulanate]     Unsure of reaction.  Found when she was an infant  . Ceclor [Cefaclor]     Unsure of reaction.  Found when she was an infant  . Duricef [Cefadroxil]     Unsure of reaction.  Found when she was an infant.  . Lorabid [Loracarbef]     Unsure of reaction.  Found when she was an infant    Current Outpatient Prescriptions on File Prior to Visit  Medication Sig Dispense Refill  . ALPRAZolam (XANAX) 0.5 MG tablet 1/2- 1 po hs as needed for anxiety  Avoid regular use.  Do not take with alcohol. 20 tablet 0  .  etonogestrel-ethinyl estradiol (NUVARING) 0.12-0.015 MG/24HR vaginal ring Place 1 each vaginally every 28 (twenty-eight) days. Insert vaginally and leave in place for 3 consecutive weeks, then remove for 1 week.    . methylphenidate (CONCERTA) 18 MG PO CR tablet Take 1 tablet (18 mg total) by mouth daily. 30 tablet 0  . sertraline (ZOLOFT) 100 MG tablet take 1 tablet by mouth once daily 90 tablet 0   No current facility-administered medications on file prior to visit.    BP 110/70 mmHg     Review of Systems  Constitutional: Positive for activity change, appetite change and fatigue.  HENT: Positive for congestion, ear pain, postnasal drip, sinus pressure and sore throat. Negative for dental problem, hearing loss, rhinorrhea and tinnitus.   Eyes: Negative for pain, discharge and visual disturbance.  Respiratory: Positive for cough. Negative for shortness of breath.   Cardiovascular: Negative for chest pain, palpitations and leg swelling.  Gastrointestinal: Negative for nausea, vomiting, abdominal pain, diarrhea, constipation, blood in stool and  abdominal distention.  Genitourinary: Negative for dysuria, urgency, frequency, hematuria, flank pain, vaginal bleeding, vaginal discharge, difficulty urinating, vaginal pain and pelvic pain.  Musculoskeletal: Negative for joint swelling, arthralgias and gait problem.  Skin: Negative for rash.  Neurological: Negative for dizziness, syncope, speech difficulty, weakness, numbness and headaches.  Hematological: Negative for adenopathy.  Psychiatric/Behavioral: Negative for behavioral problems, dysphoric mood and agitation. The patient is not nervous/anxious.        Objective:   Physical Exam  Constitutional: She is oriented to person, place, and time. She appears well-developed and well-nourished.  HENT:  Head: Normocephalic.  Right Ear: External ear normal.  Left Ear: External ear normal.  Oral pharynx slightly injected Tympanic membranes normal  Eyes: Conjunctivae and EOM are normal. Pupils are equal, round, and reactive to light.  Neck: Normal range of motion. Neck supple. No thyromegaly present.  Cardiovascular: Normal rate, regular rhythm, normal heart sounds and intact distal pulses.   Pulmonary/Chest: Effort normal and breath sounds normal.  Abdominal: Soft. Bowel sounds are normal. She exhibits no mass. There is no tenderness.  Musculoskeletal: Normal range of motion.  Lymphadenopathy:    She has no cervical adenopathy.  Neurological: She is alert and oriented to person, place, and time.  Skin: Skin is warm and dry. No rash noted.  Psychiatric: She has a normal mood and affect. Her behavior is normal.          Assessment & Plan:   Viral URI with cough Patient treated symptomatically Prescription for Hydromet dispensed

## 2014-11-24 ENCOUNTER — Other Ambulatory Visit: Payer: Self-pay | Admitting: Internal Medicine

## 2014-11-24 NOTE — Telephone Encounter (Signed)
Sent to the pharmacy by e-scribe. 

## 2014-12-06 ENCOUNTER — Telehealth: Payer: Self-pay | Admitting: Internal Medicine

## 2014-12-06 NOTE — Telephone Encounter (Signed)
Patient states she is weaning off sertraline and is now needing to take alternating dosages of 50 mg and 25 mg per Dr. Fabian SharpPanosh.  She said she did not pick up the 100 mg re-fill sent in.  She would like a callback to discuss having 2 different dosages sent in for titration purposes.  [facility] - , [place] - [address]

## 2014-12-06 NOTE — Telephone Encounter (Signed)
Would send in rx for 50 mg sertraline disp 30 refill x 1 take 1/2 to 1 po qd and wean as advised .  See past note.  directions

## 2014-12-07 MED ORDER — SERTRALINE HCL 50 MG PO TABS: ORAL_TABLET | ORAL | Status: DC

## 2014-12-07 NOTE — Telephone Encounter (Signed)
Medication sent to the pharmacy.  Left message for the pt to return my call.

## 2014-12-08 NOTE — Telephone Encounter (Signed)
Pt notified to pick up at the pharmacy. 

## 2015-01-22 ENCOUNTER — Telehealth: Payer: Self-pay | Admitting: Internal Medicine

## 2015-01-22 NOTE — Telephone Encounter (Signed)
Ok to refill x 1  

## 2015-01-22 NOTE — Telephone Encounter (Signed)
Patient is requesting re-fill on methylphenidate (CONCERTA) 18 MG PO CR tablet.

## 2015-01-23 MED ORDER — METHYLPHENIDATE HCL ER (OSM) 18 MG PO TBCR: 18.0000 mg | EXTENDED_RELEASE_TABLET | Freq: Every day | ORAL | Status: DC

## 2015-01-23 NOTE — Telephone Encounter (Signed)
Rx ready for pick up and patient is aware 

## 2015-01-24 ENCOUNTER — Telehealth: Payer: Self-pay | Admitting: Internal Medicine

## 2015-01-24 NOTE — Telephone Encounter (Signed)
Pt is trying to wean off zoloft. Pt was on 100 mg now on 25 mg and taking 25 mg every other day and the others day no med. Pt would like to know the next step

## 2015-01-24 NOTE — Telephone Encounter (Signed)
If  Feeling ok  Send in rx sertaline 25 mg disp 30 # Take  1/2 po qd for a week then every other day  For a week and then if feeling ok then try stopping ot take every 3 days until can wean

## 2015-01-25 NOTE — Telephone Encounter (Signed)
LM on listed number for the pt to return my call. 

## 2015-01-26 MED ORDER — SERTRALINE HCL 25 MG PO TABS
ORAL_TABLET | ORAL | Status: DC
Start: 2015-01-26 — End: 2015-03-20

## 2015-01-26 NOTE — Telephone Encounter (Signed)
Spoke to the pt and informed her of below directions.  Medication sent to the pharmacy by e-scribe.

## 2015-03-13 ENCOUNTER — Ambulatory Visit (INDEPENDENT_AMBULATORY_CARE_PROVIDER_SITE_OTHER): Payer: 59 | Admitting: Internal Medicine

## 2015-03-13 ENCOUNTER — Telehealth: Payer: Self-pay | Admitting: Internal Medicine

## 2015-03-13 ENCOUNTER — Ambulatory Visit (INDEPENDENT_AMBULATORY_CARE_PROVIDER_SITE_OTHER)
Admission: RE | Admit: 2015-03-13 | Discharge: 2015-03-13 | Disposition: A | Discharge status: Home / Self Care | Payer: 59 | Source: Ambulatory Visit | Attending: Internal Medicine | Admitting: Internal Medicine

## 2015-03-13 ENCOUNTER — Encounter: Payer: Self-pay | Admitting: Internal Medicine

## 2015-03-13 VITALS — BP 114/80 | Temp 98.3°F | Wt 157.2 lb

## 2015-03-13 DIAGNOSIS — M545 Low back pain: Secondary | ICD-10-CM

## 2015-03-13 DIAGNOSIS — M533 Sacrococcygeal disorders, not elsewhere classified: Secondary | ICD-10-CM

## 2015-03-13 MED ORDER — DICLOFENAC SODIUM 75 MG PO TBEC: 75.0000 mg | DELAYED_RELEASE_TABLET | Freq: Two times a day (BID) | ORAL | Status: DC

## 2015-03-13 NOTE — Progress Notes (Signed)
Pre visit review using our clinic review tool, if applicable. No additional management support is needed unless otherwise documented below in the visit note.  Chief Complaint  Patient presents with  . Lower Back Pain    Ongoing for several weeks.  Has been doing exercise and heavy lifting recently.  Sending pain down her legs and up her back.    HPI: Patient Patty Branch  comes in today for SDA for  new problem evaluation. Having back aoin for a few weeks and now much worse Team health told to see provder with in 4 hours .   Using ibuprofen very frequently with no relief as well as muscle relaxant. She states it doesn't matter with she sits stands or moves his does not relate to the intensity of her symptoms. She's not getting some symptoms of pain or radiation down the back of her left leg. Denies any specific trauma but does work out pretty previously at [employer] but no new change in her exercise. Denies specific injury may have had a remote history of some back pain that was very transient in the past. ROS: See pertinent positives and negatives per HPI. No UTI GU or bowel symptoms periods are light  because she is on can NuvaRing. no hematuria.  Past Medical History  Diagnosis Date  . Chicken pox   . GERD (gastroesophageal reflux disease)   . Seasonal allergies   . Genital warts     age 29 not sex transmitted some in gi tract or airway? vs  hsv?  . Anxiety and depression     realted to parent divorce   . ADD (attention deficit disorder)     dx 5th grade  rev[employer] kincaid; effect school perfomance math mostly on med when in school or studies   . Hx of fracture of wrist     soccer  . History of fracture of foot     Family History  Problem Relation Age of Onset  . Hypertension Father   . Depression Father   . ADD / ADHD Father   . Arthritis Maternal Aunt   . Arthritis Maternal Grandmother   . Arthritis Paternal Grandmother   . Breast cancer Paternal Grandmother   . Stroke  Paternal Grandmother   . Alcohol abuse Paternal Grandfather   . Colon cancer Paternal Grandfather     History   Social History  . Marital Status: Single    Spouse Name: N/A  . Number of Children: 0  . Years of Education: N/A   Social History Main Topics  . Smoking status: Never Smoker   . Smokeless tobacco: Not on file  . Alcohol Use: Yes     Comment: 3-4 times weekly will have 2 glasses of wine.  . Drug Use: No  . Sexual Activity:    Partners: Male   Other Topics Concern  . Not on file   Social History Narrative   8 hours of sleep per night   2 people living in the home bf   Has a dog in the home\   Neg tob soc etoh 4-5 pwe qwwk    caffeine am and tea in pm  Neg rd    Exercise more than 3 x per week.    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 ALPRAZolam (XANAX) 0.5 MG tablet 1/2- 1 po hs as needed for anxiety  Avoid regular use.  Do not take with alcohol. 20 tablet 0  . etonogestrel-ethinyl estradiol (NUVARING) 0.12-0.015 MG/24HR vaginal ring Place 1 each vaginally every 28 (twenty-eight) days. Insert vaginally and leave in place for 3 consecutive weeks, then remove for 1 week.    . methylphenidate (CONCERTA) 18 MG PO CR tablet Take 1 tablet (18 mg total) by mouth daily. 30 tablet 0  . sertraline (ZOLOFT) 25 MG tablet Take 1/2 for one week then every other day 30 tablet 0  . sertraline (ZOLOFT) 50 MG tablet Take 1/2 to a 1 tablet and wean as advised. 30 tablet 0   No facility-administered medications prior to visit.     EXAM:  BP 114/80 mmHg  Temp(Src) 98.3 F (36.8 C) (Oral)  Wt 157 lb 3.2 oz (71.305 kg)  LMP 12/31/2014  Body mass index is 25 kg/(m^2).  GENERAL: vitals reviewed and listed above, alert, oriented, appears well hydrated and in no acute distress HEENT: atraumatic,  conjunctiva  clear, no obvious abnormalities on inspection of external nose and ears CV: HRRR, no clubbing cyanosis or  peripheral edema nl cap refill  MS: moves all extremities without noticeable focal  Abnormality  mild discomfort when walking but no limp is noted she has what she calls a tender spot in the mid sacral area near the lumbar region. Little more on the left than the right. There are no masses no flank pain  abdomen is soft without hepatomegaly guarding or rebound negative SLR toe heel walk is good DTRs are present. No unusual rashes. PSYCH: pleasant and cooperative, no obvious depression or anxiety  ASSESSMENT AND PLAN:  Discussed the following assessment and plan:  Midline low back pain, with sciatica presence unspecified - Plan: DG Lumbar Spine Complete  Sacral back pain   Fairly severe and localized pain without obvious cause not related to activity per se although no systemic symptoms since had this for a while but now much worse check plain x-ray. Change anti-inflammatory and has follow-up visit next week for meds will readdress at that time. If not relieving we may get orthopedics or sports medicine to see her because of the severity. -Patient advised to return or notify health care team  if symptoms worsen ,persist or new concerns arise.  Patient Instructions  Uncertain cause of  You pain and usually    mechanichal  problem   Get x ray  Of  Back   Antiinflammatories  For pain .  voltaren   Consideration of other intervention .,    If not helping  Calming down plan sports medicine  to see.  Keep fu appt     Neta Mends. Panosh M.D.

## 2015-03-13 NOTE — Telephone Encounter (Signed)
Appointment today at 2:15

## 2015-03-13 NOTE — Patient Instructions (Addendum)
Uncertain cause of  You pain and usually    mechanichal  problem   Get x ray  Of  Back   Antiinflammatories  For pain .  voltaren   Consideration of other intervention .,    If not helping  Calming down plan sports medicine  to see.  Keep fu appt

## 2015-03-13 NOTE — Telephone Encounter (Signed)
Patient Name: Patty Branch DOB: 1986-03-18 Initial Comment Caller states having lower back pain Nurse Assessment Nurse: Elijah Birk, RN, Lynda Date/Time (Eastern Time): 03/13/2015 9:38:58 AM Confirm and document reason for call. If symptomatic, describe symptoms. ---Caller states she has been having lower back pain on & off the past couple weeks, but over the last 3 days has been constant. There seems to be one point where it is more painful. Taking Ibuprofen, not helping. Tried Methocarbamol which helped once. Has the patient traveled out of the country within the last 30 days? ---Not Applicable Does the patient require triage? ---Yes Related visit to physician within the last 2 weeks? ---No Does the PT have any chronic conditions? (i.e. diabetes, asthma, etc.) ---No Did the patient indicate they were pregnant? ---No Guidelines Guideline Title Affirmed Question Affirmed Notes Back Pain [1] Pain radiates into the thigh or further down the leg AND [2] both legs Final Disposition User See Physician within 4 Hours (or PCP triage) Elijah Birk, RN, Lynda Referrals REFERRED TO PCP OFFICE Disagree/Comply: Danella Maiers

## 2015-03-20 ENCOUNTER — Encounter: Payer: Self-pay | Admitting: Internal Medicine

## 2015-03-20 ENCOUNTER — Ambulatory Visit (INDEPENDENT_AMBULATORY_CARE_PROVIDER_SITE_OTHER): Payer: 59 | Admitting: Internal Medicine

## 2015-03-20 ENCOUNTER — Encounter: Payer: Self-pay | Admitting: Family Medicine

## 2015-03-20 VITALS — BP 110/76 | Temp 98.0°F | Wt 158.6 lb

## 2015-03-20 DIAGNOSIS — F4323 Adjustment disorder with mixed anxiety and depressed mood: Secondary | ICD-10-CM

## 2015-03-20 DIAGNOSIS — Z79899 Other long term (current) drug therapy: Secondary | ICD-10-CM | POA: Diagnosis not present

## 2015-03-20 DIAGNOSIS — F909 Attention-deficit hyperactivity disorder, unspecified type: Secondary | ICD-10-CM

## 2015-03-20 DIAGNOSIS — M533 Sacrococcygeal disorders, not elsewhere classified: Secondary | ICD-10-CM | POA: Diagnosis not present

## 2015-03-20 DIAGNOSIS — F988 Other specified behavioral and emotional disorders with onset usually occurring in childhood and adolescence: Secondary | ICD-10-CM

## 2015-03-20 MED ORDER — METHYLPHENIDATE HCL ER (OSM) 18 MG PO TBCR: 18.0000 mg | EXTENDED_RELEASE_TABLET | Freq: Every day | ORAL | Status: DC

## 2015-03-20 MED ORDER — SERTRALINE HCL 25 MG PO TABS: 25.0000 mg | ORAL_TABLET | Freq: Every day | ORAL | Status: DC

## 2015-03-20 NOTE — Progress Notes (Signed)
Pre visit review using our clinic review tool, if applicable. No additional management support is needed unless otherwise documented below in the visit note.  Chief Complaint  Patient presents with  . Follow-up    HPI: Patty Branch 29 y.o. comes in for fu adhd med and back pain   Back pain is getting better  From last week .    About  85 % better.   Focal when touches  Staying on antiinfalmmatory . No go se .  ADHD:   Back on concerta  At this time  Couple days a week  After off a while   Lots of work needed when leave.   To get thinkgs done  Work and academic and attention  And retaining  Info better.  No se  Sleep  Effects  Moodiness   Has been trying to wean  sertalin ea nd on 12.5 qod feeling anxious and some depression at times. Moving next week to [place] for school. Uncertain if it's the right times try to get off that she has had some problems weaning. May want to stay on low dose during this transition period and then try to wean and another time.  Negative tobacco alcohol 10-15 him week. Not every day but goes out on the weekend social  3 at a time .  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 Neg cp sob  Other med problem   Past Medical History  Diagnosis Date  . Chicken pox   . GERD (gastroesophageal reflux disease)   . Seasonal allergies   . Genital warts     age 60 not sex transmitted some in gi tract or airway? vs  hsv?  . Anxiety and depression     realted to parent divorce   . ADD (attention deficit disorder)     dx 5th grade  revbecca kincaid; effect school perfomance math mostly on med when in school or studies   . Hx of fracture of wrist     soccer  . History of fracture of foot     Family History  Problem Relation Age of Onset  . Hypertension Father   . Depression Father   . ADD / ADHD Father   . Arthritis Maternal Aunt   . Arthritis Maternal Grandmother   . Arthritis Paternal Grandmother   .  Breast cancer Paternal Grandmother   . Stroke Paternal Grandmother   . Alcohol abuse Paternal Grandfather   . Colon cancer Paternal Grandfather     History   Social History  . Marital Status: Single    Spouse Name: N/A  . Number of Children: 0  . Years of Education: N/A   Social History Main Topics  . Smoking status: Never Smoker   . Smokeless tobacco: Not on file  . Alcohol Use: Yes     Comment: 3-4 times weekly will have 2 glasses of wine.  . Drug Use: No  . Sexual Activity:    Partners: Male   Other Topics Concern  . Not on file   Social History Narrative   8 hours of sleep per night   2 people living in the home bf   Has a dog in the home\   Neg tob soc etoh 4-5 pwe qwwk    caffeine am and tea in pm  Neg rd    Exercise more than 3 x per week.  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 ALPRAZolam (XANAX) 0.5 MG tablet 1/2- 1 po hs as needed for anxiety  Avoid regular use.  Do not take with alcohol. 20 tablet 0  . diclofenac (VOLTAREN) 75 MG EC tablet Take 1 tablet (75 mg total) by mouth 2 (two) times daily. 30 tablet 0  . etonogestrel-ethinyl estradiol (NUVARING) 0.12-0.015 MG/24HR vaginal ring Place 1 each vaginally every 28 (twenty-eight) days. Insert vaginally and leave in place for 3 consecutive weeks, then remove for 1 week.    . methylphenidate (CONCERTA) 18 MG PO CR tablet Take 1 tablet (18 mg total) by mouth daily. 30 tablet 0  . sertraline (ZOLOFT) 25 MG tablet Take 1/2 for one week then every other day 30 tablet 0   No facility-administered medications prior to visit.     EXAM:  BP 110/76 mmHg  Temp(Src) 98 F (36.7 C) (Oral)  Wt 158 lb 9.6 oz (71.94 kg)  LMP 12/31/2014  Body mass index is 25.22 kg/(m^2).  GENERAL: vitals reviewed and listed above, alert, oriented,  appears well hydrated and in no acute distress HEENT: atraumatic, conjunctiva  clear, no obvious abnormalities on inspection of external nose and earsMS: moves all extremities without noticeable focal  Abnormality back min tender left sacral area.  PSYCH: pleasant and cooperative, no obvious depression or anxiety some increase motor activity   ASSESSMENT AND PLAN:  Discussed the following assessment and plan:  ADD (attention deficit disorder) - benefit more than risk contract signed tox screen  90 days contact for refill 6 month check   Sacral back pain - imp back exercise stretches   Medication management  Adjustment disorder with mixed anxiety and depressed mood - can stay on low dose sert   other aoptins as  poss  counseled about avoiding anxiety producing substances. Contract tox screen  For meds if continues   discussed contribution of alcohol and marijuana to anxiety symptoms so we should stop that if she is having difficulties. Told her would not continue to prescribe controlled ADD medicine infusing medications or substances a decrease in short-term memory such as THC. She agrees and is aware. She will try temporarily off the alcohol and see her anxiety goes and can stay on the Zoloft 25 mg day or decrease to 12.5 daily. Contact us for further advice. 90 day prescription given she can sign up for my chart send [place] in a message for 3 months prescription 20 is in town in October or have some relative pick  up with ID. Otherwise office visit in 6 months or thereabouts. Pt says only take alpraz for sleep under certain circumstances not regulary  And has been using it this way since  In HS.  -Patient advised to return or notify health care team  if symptoms worsen ,persist or new concerns arise.  Patient Instructions  meds as discussed   Contact us  In fall for 90 day refill   Require   Ov med check  Before 6 months rx runs out . Limit alcohol  Substances that  Affect memory and anxiety .    Currently .  Exercise and natural sleep are important.  Back exercise may help  If  persistent or progressive consider sports medicine .     Neta Mends. Danielly Ackerley  M.D.   

## 2015-03-20 NOTE — Patient Instructions (Addendum)
meds as discussed   Contact us  In fall for 90 day refill   Require   Ov med check  Before 6 months rx runs out . Limit alcohol  Substances that  Affect memory and anxiety .   Currently .  Exercise and natural sleep are important.  Back exercise may help  If  persistent or progressive consider sports medicine .

## 2015-04-17 ENCOUNTER — Encounter: Payer: Self-pay | Admitting: Internal Medicine

## 2015-08-09 ENCOUNTER — Telehealth: Payer: Self-pay | Admitting: Internal Medicine

## 2015-08-09 NOTE — Telephone Encounter (Signed)
Pt needs new rx concerta 18 mg

## 2015-08-10 MED ORDER — METHYLPHENIDATE HCL ER (OSM) 18 MG PO TBCR: 18.0000 mg | EXTENDED_RELEASE_TABLET | Freq: Every day | ORAL | Status: DC

## 2015-08-10 NOTE — Telephone Encounter (Signed)
Rx is ready for pick up.  Attempted to call pt to make aware but vm is full.  I will attempt to call pt at a later time.

## 2015-08-10 NOTE — Telephone Encounter (Signed)
Pt is aware.  

## 2015-08-10 NOTE — Telephone Encounter (Signed)
done

## 2015-11-05 NOTE — Progress Notes (Signed)
Pre visit review using our clinic review tool, if applicable. No additional management support is needed unless otherwise documented below in the visit note.  Chief Complaint  Patient presents with  . Anxiety    HPI: Patty Branch 30 y.o.  Comes in because of increasing anxiety ans sometimes panic attacks   To go back to  [place] and has bf there supportive had epsoes  ADD meds  No MJ rd  Did have some etoh pre event but not daily and denies  Passing out memory lapses or    Reg use problem  etoh  mj other  Anxiety :  Serious  Anxiety attack Sunday  And had  facde twitching and numb and  Felt  Like   911    Felt like dying  Last 40 minutes  And had some xanax and  Feeling like could have repeat.  Late summer  Tried off serttraline . 12. 5 mg    Up to 50 today .   Lots of stress. Looking for counselor back in [place] .  ROS: See pertinent positives and negatives per HPI.  Past Medical History  Diagnosis Date  . Chicken pox   . GERD (gastroesophageal reflux disease)   . Seasonal allergies   . Genital warts     age 30 not sex transmitted some in gi tract or airway? vs  hsv?  . Anxiety and depression     realted to parent divorce   . ADD (attention deficit disorder)     dx 5th grade  revbecca kincaid; effect school perfomance math mostly on med when in school or studies   . Hx of fracture of wrist     soccer  . History of fracture of foot     Family History  Problem Relation Age of Onset  . Hypertension Father   . Depression Father   . ADD / ADHD Father   . Arthritis Maternal Aunt   . Arthritis Maternal Grandmother   . Arthritis Paternal Grandmother   . Breast cancer Paternal Grandmother   . Stroke Paternal Grandmother   . Alcohol abuse Paternal Grandfather   . Colon cancer Paternal Grandfather     Social History   Social History  . Marital Status: Single    Spouse Name: N/A  . Number of Children: 0  . Years of Education: N/A   Social History Main Topics  .  Smoking status: Never Smoker   . Smokeless tobacco: Not on file  . Alcohol Use: Yes     Comment: 3-4 times weekly will have 2 glasses of wine.  . Drug Use: No  . Sexual Activity:    Partners: Male   Other Topics Concern  . Not on file   Social History Narrative   8 hours of sleep per night   2 people living in the home bf   Has a dog in the home\   Neg tob soc etoh 4-5 pwe qwwk    caffeine am and tea in pm  Neg rd    Exercise more than 3 x per week.    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 ALPRAZolam (XANAX) 0.5 MG tablet 1/2- 1 po hs as needed for anxiety  Avoid regular use.  Do not take with alcohol.  20 tablet 0  . methylphenidate (CONCERTA) 18 MG PO CR tablet Take 1 tablet (18 mg total) by mouth daily. 30 tablet 0  . sertraline (ZOLOFT) 25 MG tablet Take 1 tablet (25 mg total) by mouth daily. Or as directed 90 tablet 1  . diclofenac (VOLTAREN) 75 MG EC tablet Take 1 tablet (75 mg total) by mouth 2 (two) times daily. 30 tablet 0  . etonogestrel-ethinyl estradiol (NUVARING) 0.12-0.015 MG/24HR vaginal ring Place 1 each vaginally every 28 (twenty-eight) days. Insert vaginally and leave in place for 3 consecutive weeks, then remove for 1 week.     No facility-administered medications prior to visit.     EXAM:  BP 104/64 mmHg  Temp(Src) 98.2 F (36.8 C) (Oral)  Wt 146 lb (66.225 kg)  Body mass index is 23.21 kg/(m^2).  GENERAL: vitals reviewed and listed above, alert, oriented, appears well hydrated and in no acute distress midl anxiety nl affect speech no tremor  HEENT: atraumatic, conjunctiva  clear, no obvious abnormalities on inspection of external nose and ears OP : no lesion edema or exudate  NECK: no obvious masses on inspection palpation  LUNGS: clear to auscultation bilaterally, no  wheezes, rales or rhonchi, good air movement CV: HRRR, no clubbing cyanosis or  peripheral edema nl cap refill  MS: moves all extremities without noticeable focal  abnormality PSYCH: pleasant and cooperative,   No tremor  ASSESSMENT AND PLAN: Last tox screen was clear   Discussed the following assessment and plan:  Anxiety  ADD (attention deficit disorder) - med doesnt appear to be inciting anxiety  Panic attack  Adjustment disorder with mixed anxiety and depressed mood  Medication management Last wellness  Feb 16 Father is a Veterinary surgeon .... Agree going back on sertraline and disc about avoiding etoh and RD use that can cause se and  Rebound anxiety   Risk benefit of medication discussed.   Expectant management. And avoid dependency on benzos  -Patient advised to return or notify health care team  if symptoms worsen ,persist or new concerns arise.  Patient Instructions   Increase sertraline to 50 mg for 1-2 weeks  Then 100 mg per day to help control the panic attacks. Avoid alcohol for now  To avoid  Triggering issues .   alprazolam can be helpful for panic attacks but needs close fu because can cause rebound anxiety .       Panic Attacks Panic attacks are sudden, short-livedsurges of severe anxiety, fear, or discomfort. They may occur for no reason when you are relaxed, when you are anxious, or when you are sleeping. Panic attacks may occur for a number of reasons:   Healthy people occasionally have panic attacks in extreme, life-threatening situations, such as war or natural disasters. Normal anxiety is a protective mechanism of the body that helps [place] react to danger (fight or flight response).  Panic attacks are often seen with anxiety disorders, such as panic disorder, social anxiety disorder, generalized anxiety disorder, and phobias. Anxiety disorders cause excessive or uncontrollable anxiety. They may interfere with your relationships or other life activities.  Panic  attacks are sometimes seen with other mental illnesses, such as depression and posttraumatic stress disorder.  Certain medical conditions, prescription medicines, and drugs of abuse can cause panic attacks. SYMPTOMS  Panic attacks start suddenly, peak within 20 minutes, and are accompanied by four or more of the following symptoms:  Pounding heart or fast heart rate (palpitations).  Sweating.  Trembling or shaking.  Shortness of  breath or feeling smothered.  Feeling choked.  Chest pain or discomfort.  Nausea or strange feeling in your stomach.  Dizziness, light-headedness, or feeling like you will faint.  Chills or hot flushes.  Numbness or tingling in your lips or hands and feet.  Feeling that things are not real or feeling that you are not yourself.  Fear of losing control or going crazy.  Fear of dying. Some of these symptoms can mimic serious medical conditions. For example, you may think you are having a heart attack. Although panic attacks can be very scary, they are not life threatening. DIAGNOSIS  Panic attacks are diagnosed through an assessment by your health care provider. Your health care provider will ask questions about your symptoms, such as where and when they occurred. Your health care provider will also ask about your medical history and use of alcohol and drugs, including prescription medicines. Your health care provider may order blood tests or other studies to rule out a serious medical condition. Your health care provider may refer you to a mental health professional for further evaluation. TREATMENT   Most healthy people who have one or two panic attacks in an extreme, life-threatening situation will not require treatment.  The treatment for panic attacks associated with anxiety disorders or other mental illness typically involves counseling with a mental health professional, medicine, or a combination of both. Your health care provider will help determine  what treatment is best for you.  Panic attacks due to physical illness usually go away with treatment of the illness. If prescription medicine is causing panic attacks, talk with your health care provider about stopping the medicine, decreasing the dose, or substituting another medicine.  Panic attacks due to alcohol or drug abuse go away with abstinence. Some adults need professional help in order to stop drinking or using drugs. HOME CARE INSTRUCTIONS   Take all medicines as directed by your health care provider.   Schedule and attend follow-up visits as directed by your health care provider. It is important to keep all your appointments. SEEK MEDICAL CARE IF:  You are not able to take your medicines as prescribed.  Your symptoms do not improve or get worse. SEEK IMMEDIATE MEDICAL CARE IF:   You experience panic attack symptoms that are different than your usual symptoms.  You have serious thoughts about hurting yourself or others.  You are taking medicine for panic attacks and have a serious side effect. MAKE SURE YOU:  Understand these instructions.  Will watch your condition.  Will get help right away if you are not doing well or get worse.   This information is not intended to replace advice given to you by your health care provider. Make sure you discuss any questions you have with your health care provider.   Document Released: 08/04/2005 Document Revised: 08/09/2013 Document Reviewed: 03/18/2013 Elsevier Interactive Patient Education 2016 [employer].      [place]. Panosh M.D.

## 2015-11-06 ENCOUNTER — Ambulatory Visit (INDEPENDENT_AMBULATORY_CARE_PROVIDER_SITE_OTHER): Payer: PPO | Admitting: Internal Medicine

## 2015-11-06 VITALS — BP 104/64 | Temp 98.2°F | Wt 146.0 lb

## 2015-11-06 DIAGNOSIS — F410 Panic disorder [episodic paroxysmal anxiety] without agoraphobia: Secondary | ICD-10-CM

## 2015-11-06 DIAGNOSIS — F4323 Adjustment disorder with mixed anxiety and depressed mood: Secondary | ICD-10-CM

## 2015-11-06 DIAGNOSIS — Z79899 Other long term (current) drug therapy: Secondary | ICD-10-CM | POA: Diagnosis not present

## 2015-11-06 DIAGNOSIS — F909 Attention-deficit hyperactivity disorder, unspecified type: Secondary | ICD-10-CM

## 2015-11-06 DIAGNOSIS — F419 Anxiety disorder, unspecified: Secondary | ICD-10-CM | POA: Diagnosis not present

## 2015-11-06 DIAGNOSIS — F988 Other specified behavioral and emotional disorders with onset usually occurring in childhood and adolescence: Secondary | ICD-10-CM

## 2015-11-06 MED ORDER — ALPRAZOLAM 0.5 MG PO TABS: ORAL_TABLET | ORAL | Status: AC

## 2015-11-06 MED ORDER — METHYLPHENIDATE HCL ER (OSM) 18 MG PO TBCR: 18.0000 mg | EXTENDED_RELEASE_TABLET | Freq: Every day | ORAL | Status: AC

## 2015-11-06 MED ORDER — SERTRALINE HCL 100 MG PO TABS: 100.0000 mg | ORAL_TABLET | Freq: Every day | ORAL | Status: AC

## 2015-11-06 NOTE — Patient Instructions (Addendum)
Increase sertraline to 50 mg for 1-2 weeks  Then 100 mg per day to help control the panic attacks. Avoid alcohol for now  To avoid  Triggering issues .   alprazolam can be helpful for panic attacks but needs close fu because can cause rebound anxiety .       Panic Attacks Panic attacks are sudden, short-livedsurges of severe anxiety, fear, or discomfort. They may occur for no reason when you are relaxed, when you are anxious, or when you are sleeping. Panic attacks may occur for a number of reasons:   Healthy people occasionally have panic attacks in extreme, life-threatening situations, such as war or natural disasters. Normal anxiety is a protective mechanism of the body that helps us react to danger (fight or flight response).  Panic attacks are often seen with anxiety disorders, such as panic disorder, social anxiety disorder, generalized anxiety disorder, and phobias. Anxiety disorders cause excessive or uncontrollable anxiety. They may interfere with your relationships or other life activities.  Panic attacks are sometimes seen with other mental illnesses, such as depression and posttraumatic stress disorder.  Certain medical conditions, prescription medicines, and drugs of abuse can cause panic attacks. SYMPTOMS  Panic attacks start suddenly, peak within 20 minutes, and are accompanied by four or more of the following symptoms:  Pounding heart or fast heart rate (palpitations).  Sweating.  Trembling or shaking.  Shortness of breath or feeling smothered.  Feeling choked.  Chest pain or discomfort.  Nausea or strange feeling in your stomach.  Dizziness, light-headedness, or feeling like you will faint.  Chills or hot flushes.  Numbness or tingling in your lips or hands and feet.  Feeling that things are not real or feeling that you are not yourself.  Fear of losing control or going crazy.  Fear of dying. Some of these symptoms can mimic serious medical  conditions. For example, you may think you are having a heart attack. Although panic attacks can be very scary, they are not life threatening. DIAGNOSIS  Panic attacks are diagnosed through an assessment by your health care provider. Your health care provider will ask questions about your symptoms, such as where and when they occurred. Your health care provider will also ask about your medical history and use of alcohol and drugs, including prescription medicines. Your health care provider may order blood tests or other studies to rule out a serious medical condition. Your health care provider may refer you to a mental health professional for further evaluation. TREATMENT   Most healthy people who have one or two panic attacks in an extreme, life-threatening situation will not require treatment.  The treatment for panic attacks associated with anxiety disorders or other mental illness typically involves counseling with a mental health professional, medicine, or a combination of both. Your health care provider will help determine what treatment is best for you.  Panic attacks due to physical illness usually go away with treatment of the illness. If prescription medicine is causing panic attacks, talk with your health care provider about stopping the medicine, decreasing the dose, or substituting another medicine.  Panic attacks due to alcohol or drug abuse go away with abstinence. Some adults need professional help in order to stop drinking or using drugs. HOME CARE INSTRUCTIONS   Take all medicines as directed by your health care provider.   Schedule and attend follow-up visits as directed by your health care provider. It is important to keep all your appointments. SEEK MEDICAL CARE IF:  You  are not able to take your medicines as prescribed.  Your symptoms do not improve or get worse. SEEK IMMEDIATE MEDICAL CARE IF:   You experience panic attack symptoms that are different than your usual  symptoms.  You have serious thoughts about hurting yourself or others.  You are taking medicine for panic attacks and have a serious side effect. MAKE SURE YOU:  Understand these instructions.  Will watch your condition.  Will get help right away if you are not doing well or get worse.   This information is not intended to replace advice given to you by your health care provider. Make sure you discuss any questions you have with your health care provider.   Document Released: 08/04/2005 Document Revised: 08/09/2013 Document Reviewed: 03/18/2013 Elsevier Interactive Patient Education [employer]

## 2015-11-11 ENCOUNTER — Encounter: Payer: Self-pay | Admitting: Internal Medicine

## 2016-08-03 IMAGING — CR DG LUMBAR SPINE COMPLETE 4+V
5 series · 5 of 5 positions shown · non-contrast
Comparison: None.

CLINICAL DATA: Constant, dull low back pain for the past 3 days,
radiating down the left leg. No known injury.

EXAM:
LUMBAR SPINE - COMPLETE 4+ VIEW

[view not recorded (1 of 5)]
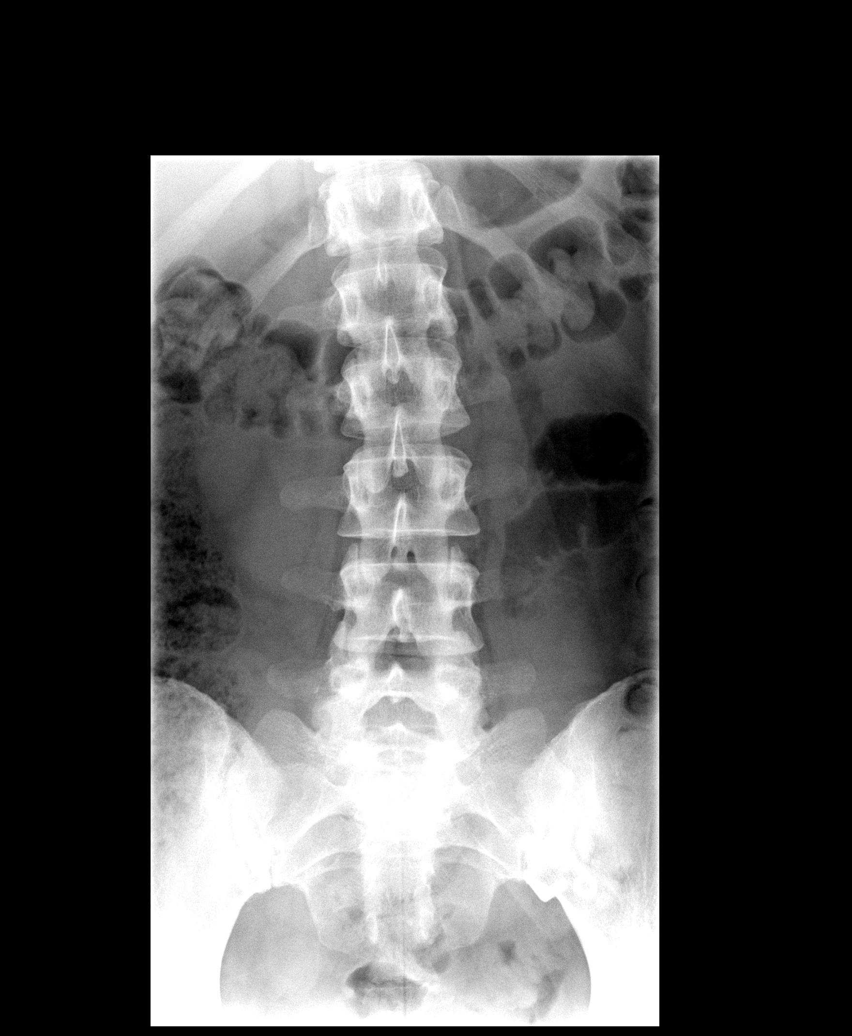

[view not recorded (2 of 5)]
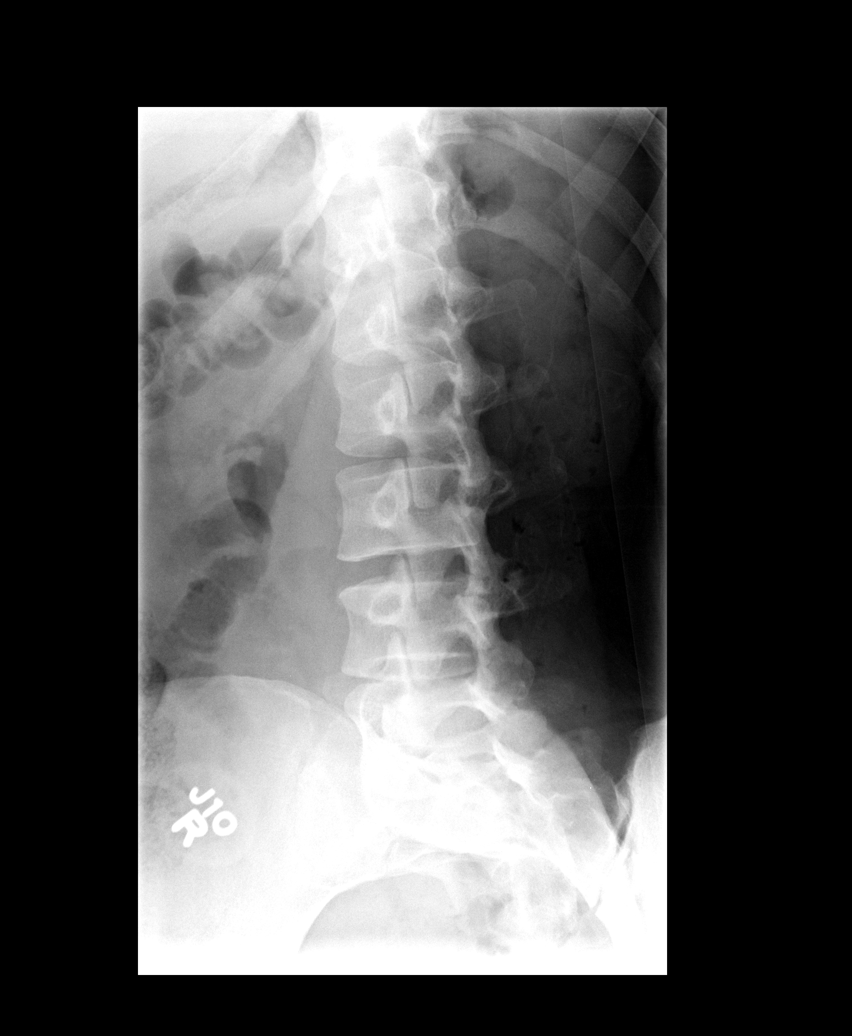

[view not recorded (3 of 5)]
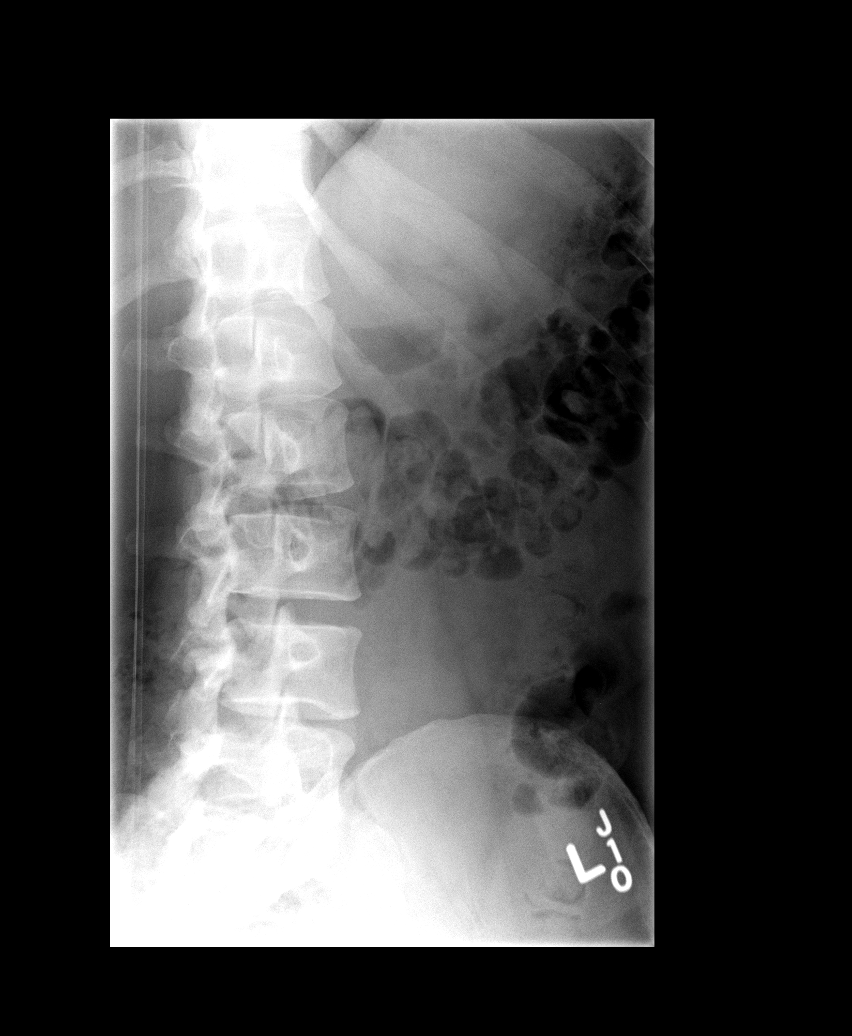

[view not recorded (4 of 5)]
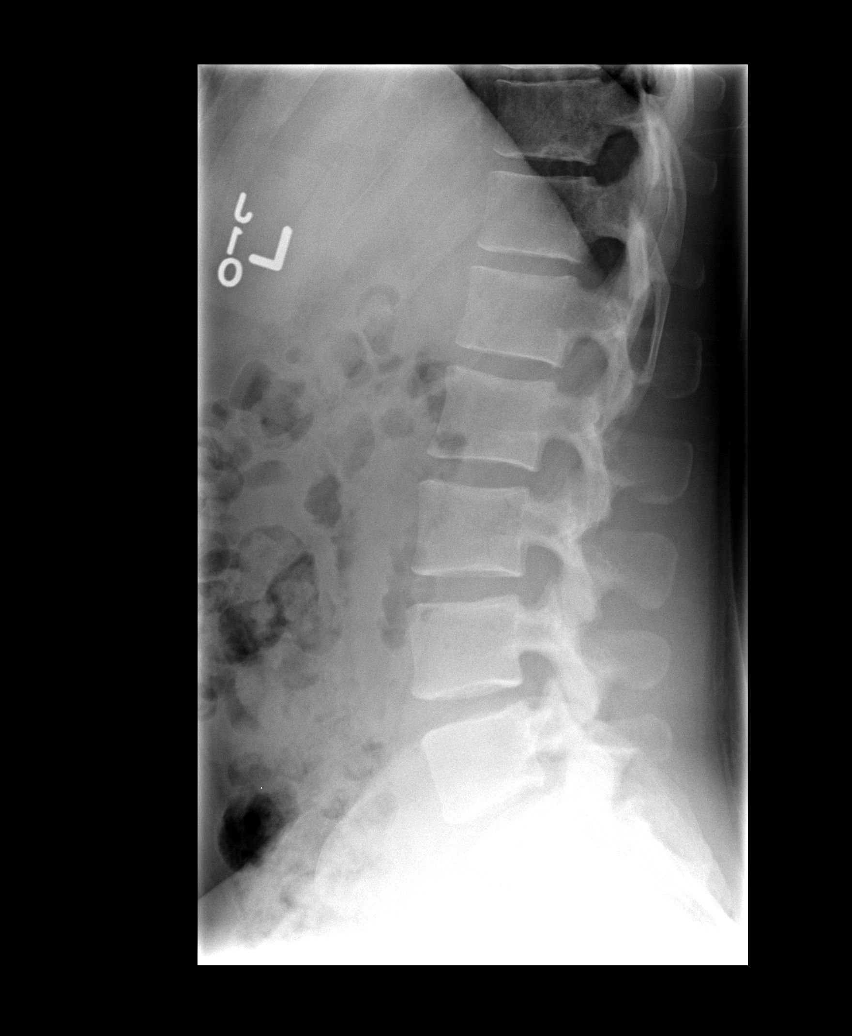

[view not recorded (5 of 5)]
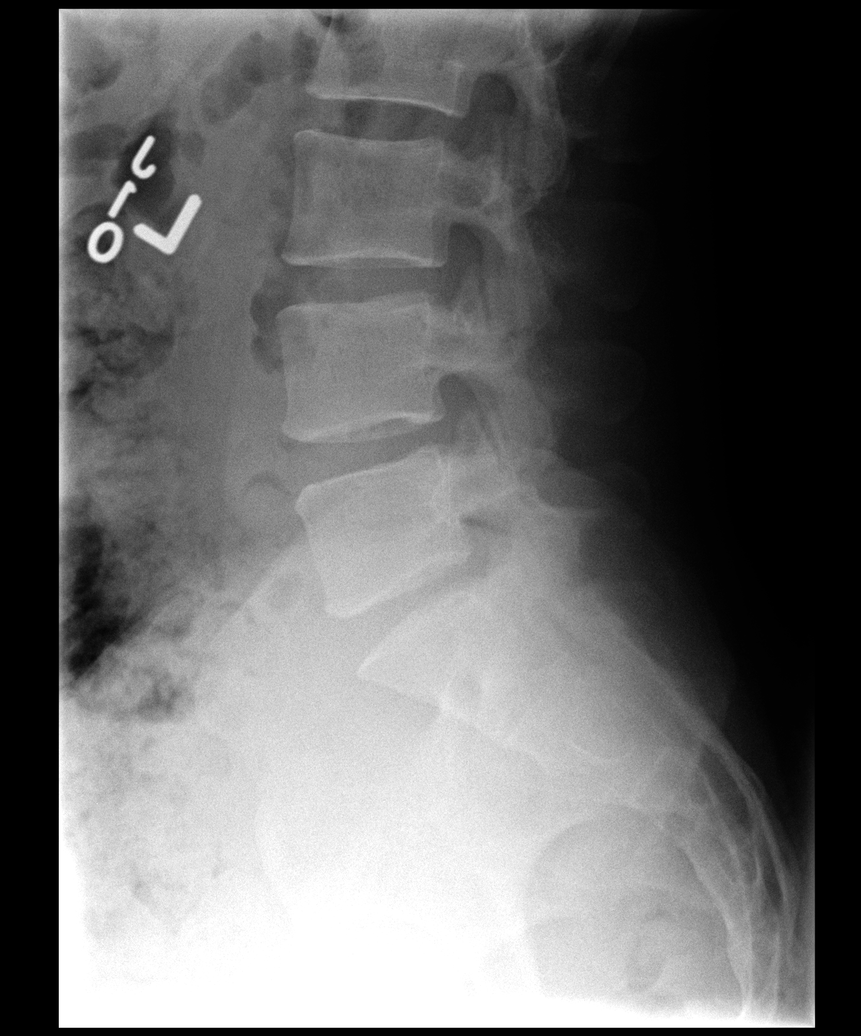

[5 of 5 positions shown; findings below may reference images not displayed]

FINDINGS: Five non-rib-bearing lumbar vertebrae. Mild levoconvex thoracolumbar
scoliosis. Otherwise, normal appearing bones and soft tissues. No
pars defects or subluxations.
IMPRESSION: Mild levoconvex thoracolumbar scoliosis. Otherwise, normal
examination.

## 2016-08-16 ENCOUNTER — Ambulatory Visit (INDEPENDENT_AMBULATORY_CARE_PROVIDER_SITE_OTHER): Payer: PPO | Admitting: Internal Medicine

## 2016-08-16 DIAGNOSIS — R059 Cough, unspecified: Secondary | ICD-10-CM

## 2016-08-16 DIAGNOSIS — R05 Cough: Secondary | ICD-10-CM | POA: Diagnosis not present

## 2016-08-16 MED ORDER — HYDROCODONE-HOMATROPINE 5-1.5 MG/5ML PO SYRP: 5.0000 mL | ORAL_SOLUTION | Freq: Four times a day (QID) | ORAL | 0 refills | Status: AC | PRN

## 2016-08-16 MED ORDER — HYDROCODONE-HOMATROPINE 5-1.5 MG/5ML PO SYRP: 5.0000 mL | ORAL_SOLUTION | Freq: Four times a day (QID) | ORAL | 0 refills | Status: DC | PRN

## 2016-08-16 MED ORDER — AZITHROMYCIN 250 MG PO TABS: ORAL_TABLET | ORAL | 0 refills | Status: AC

## 2016-08-16 NOTE — Patient Instructions (Signed)
Please take all new medication as prescribed  - the antibiotic and cough medicine if needed  Please continue all other medications as before, and refills have been done if requested.  Please have the pharmacy call with any other refills you may need.  Please keep your appointments with your specialists as you may have planned  Your are given the note today

## 2016-08-16 NOTE — Progress Notes (Signed)
Pre visit review using our clinic review tool, if applicable. No additional management support is needed unless otherwise documented below in the visit note. 

## 2016-08-17 NOTE — Assessment & Plan Note (Signed)
C/w URI, cant r/o bacterial, Mild to mod, for antibx course,  to f/u any worsening symptoms or concerns

## 2016-08-17 NOTE — Progress Notes (Signed)
Subjective:    Patient ID: Patty BibleMara P Sturdy, female    DOB: January 20, 1986, 30 y.o.   MRN: [id number]  HPI   Here with 2-3 days acute onset fever, facial pain, pressure, headache, general weakness and malaise, and greenish d/c, with mild ST and cough, but pt denies chest pain, wheezing, increased sob or doe, orthopnea, PND, increased LE swelling, palpitations, dizziness or syncope. Past Medical History:  Diagnosis Date  . ADD (attention deficit disorder)    dx 5th grade  revbecca kincaid; effect school perfomance math mostly on med when in school or studies   . Anxiety and depression    realted to parent divorce   . Chicken pox   . Genital warts    age 30 not sex transmitted some in gi tract or airway? vs  hsv?  . GERD (gastroesophageal reflux disease)   . History of fracture of foot   . Hx of fracture of wrist    soccer  . Seasonal allergies    Past Surgical History:  Procedure Laterality Date  . TONSILLECTOMY AND ADENOIDECTOMY  1993    reports that she has never smoked. She does not have any smokeless tobacco history on file. She reports that she drinks alcohol. She reports that she does not use drugs. family history includes ADD / ADHD in her father; Alcohol abuse in her paternal grandfather; Arthritis in her maternal aunt, maternal grandmother, and paternal grandmother; Breast cancer in her paternal grandmother; Colon cancer in her paternal grandfather; Depression in her father; Hypertension in her father; Stroke in her paternal grandmother. Allergies  Allergen Reactions  . Amoxicillin     Unsure of reaction.  Found when she was an infant  . Augmentin [Amoxicillin-Pot Clavulanate]     Unsure of reaction.  Found when she was an infant  . Ceclor [Cefaclor]     Unsure of reaction.  Found when she was an infant  . Duricef [Cefadroxil]     Unsure of reaction.  Found when she was an infant.  . Lorabid [Loracarbef]     Unsure of reaction.  Found when she was an infant  .  Sulfamethoxazole Other (See Comments)    Unknown reaction   Current Outpatient Prescriptions on File Prior to Visit  Medication Sig Dispense Refill  . ALPRAZolam (XANAX) 0.5 MG tablet 1 po bid  as needed for anxiety /panic Avoid regular use.  Do not take with alcohol. 30 tablet 0  . methylphenidate (CONCERTA) 18 MG PO CR tablet Take 1 tablet (18 mg total) by mouth daily. 30 tablet 0  . sertraline (ZOLOFT) 100 MG tablet Take 1 tablet (100 mg total) by mouth daily. Or As directed 30 tablet 3   No current facility-administered medications on file prior to visit.    Review of Systems All otherwise neg per pt     Objective:   Physical Exam BP 128/80   Pulse 95   Temp 98.2 F (36.8 C) (Oral)   Resp 20   Wt 159 lb (72.1 kg)   SpO2 98%   BMI 25.28 kg/m  VS noted,  Constitutional: Pt appears in no apparent distress HENT: Head: NCAT.  Right Ear: External ear normal.  Left Ear: External ear normal.  Eyes: . Pupils are equal, round, and reactive to light. Conjunctivae and EOM are normal Bilat tm's with mild erythema.  Max sinus areas mild tender.  Pharynx with mild erythema, no exudate Neck: Normal range of motion. Neck supple. with bilat tender submandibular LA  Cardiovascular: Normal rate and regular rhythm.   Pulmonary/Chest: Effort normal and breath sounds decreased without rales or wheezing.  Neurological: Pt is alert. Not confused , motor grossly intact Skin: Skin is warm. No rash, no LE edema Psychiatric: Pt behavior is normal. No agitation.      Assessment & Plan:
